# Patient Record
Sex: Female | Born: 1990 | Hispanic: Yes | Marital: Single
Health system: Southern US, Community
[De-identification: ages and names within clinical notes are randomized; demographics above are authoritative.]

---

## 2020-08-10 ENCOUNTER — Emergency Department: Payer: Self-pay

## 2020-08-10 ENCOUNTER — Other Ambulatory Visit: Payer: Self-pay

## 2020-08-10 ENCOUNTER — Emergency Department
Admission: EM | Admit: 2020-08-10 | Discharge: 2020-08-11 | Disposition: A | Discharge status: Other Acute Inpatient Hospital | Payer: Self-pay | Attending: Emergency Medicine | Admitting: Emergency Medicine

## 2020-08-10 DIAGNOSIS — S32010A Wedge compression fracture of first lumbar vertebra, initial encounter for closed fracture: Secondary | ICD-10-CM | POA: Insufficient documentation

## 2020-08-10 DIAGNOSIS — Z20822 Contact with and (suspected) exposure to covid-19: Secondary | ICD-10-CM | POA: Insufficient documentation

## 2020-08-10 DIAGNOSIS — S32000A Wedge compression fracture of unspecified lumbar vertebra, initial encounter for closed fracture: Secondary | ICD-10-CM

## 2020-08-10 DIAGNOSIS — S32020A Wedge compression fracture of second lumbar vertebra, initial encounter for closed fracture: Secondary | ICD-10-CM | POA: Insufficient documentation

## 2020-08-10 DIAGNOSIS — S82302A Unspecified fracture of lower end of left tibia, initial encounter for closed fracture: Secondary | ICD-10-CM | POA: Insufficient documentation

## 2020-08-10 DIAGNOSIS — X58XXXA Exposure to other specified factors, initial encounter: Secondary | ICD-10-CM | POA: Insufficient documentation

## 2020-08-10 DIAGNOSIS — S32030A Wedge compression fracture of third lumbar vertebra, initial encounter for closed fracture: Secondary | ICD-10-CM | POA: Insufficient documentation

## 2020-08-10 LAB — CBC WITH DIFFERENTIAL/PLATELET
Abs Immature Granulocytes: 0.11 10*3/uL — ABNORMAL HIGH (ref 0.00–0.07)
Basophils Absolute: 0 10*3/uL (ref 0.0–0.1)
Basophils Relative: 0 %
Eosinophils Absolute: 0.1 10*3/uL (ref 0.0–0.5)
Eosinophils Relative: 0 %
HCT: 36.9 % (ref 36.0–46.0)
Hemoglobin: 12.7 g/dL (ref 12.0–15.0)
Immature Granulocytes: 1 %
Lymphocytes Relative: 16 %
Lymphs Abs: 1.9 10*3/uL (ref 0.7–4.0)
MCH: 32.8 pg (ref 26.0–34.0)
MCHC: 34.4 g/dL (ref 30.0–36.0)
MCV: 95.3 fL (ref 80.0–100.0)
Monocytes Absolute: 0.9 10*3/uL (ref 0.1–1.0)
Monocytes Relative: 8 %
Neutro Abs: 9.2 10*3/uL — ABNORMAL HIGH (ref 1.7–7.7)
Neutrophils Relative %: 75 %
Platelets: 422 10*3/uL — ABNORMAL HIGH (ref 150–400)
RBC: 3.87 MIL/uL (ref 3.87–5.11)
RDW: 12.4 % (ref 11.5–15.5)
WBC: 12.2 10*3/uL — ABNORMAL HIGH (ref 4.0–10.5)
nRBC: 0 % (ref 0.0–0.2)

## 2020-08-10 LAB — BASIC METABOLIC PANEL
Anion gap: 9 (ref 5–15)
BUN: 12 mg/dL (ref 6–20)
CO2: 26 mmol/L (ref 22–32)
Calcium: 9.1 mg/dL (ref 8.9–10.3)
Chloride: 102 mmol/L (ref 98–111)
Creatinine, Ser: 0.54 mg/dL (ref 0.44–1.00)
GFR, Estimated: >60 mL/min (ref >60)
Glucose, Bld: 91 mg/dL (ref 70–99)
Potassium: 4.1 mmol/L (ref 3.5–5.1)
Sodium: 137 mmol/L (ref 135–145)

## 2020-08-10 MED ORDER — HYDROMORPHONE HCL 1 MG/ML IJ SOLN
0.5000 mg | Freq: Once | INTRAMUSCULAR | Status: AC
Start: 2020-08-10 — End: 2020-08-10
  Administered 2020-08-10: 0.5 mg via INTRAVENOUS
  Filled 2020-08-10: qty 1

## 2020-08-10 MED ORDER — MORPHINE SULFATE (PF) 4 MG/ML IV SOLN
4.0000 mg | Freq: Once | INTRAVENOUS | Status: AC
  Administered 2020-08-10: 4 mg via INTRAVENOUS
  Filled 2020-08-10: qty 1

## 2020-08-10 MED ORDER — ONDANSETRON HCL 4 MG/2ML IJ SOLN
4.0000 mg | Freq: Once | INTRAMUSCULAR | Status: AC
Start: 2020-08-10 — End: 2020-08-10
  Administered 2020-08-10: 4 mg via INTRAVENOUS
  Filled 2020-08-10: qty 2

## 2020-08-10 NOTE — ED Notes (Signed)
[facility] on unit for transport to [facility]

## 2020-08-10 NOTE — ED Provider Notes (Signed)
[facility] Emergency Department Provider Note ____________________________________________   Event Date/Time   First MD Initiated Contact with Patient 08/10/20 1523     (approximate)  I have reviewed the triage vital signs and the nursing notes.   HISTORY  Chief Complaint Extremity Pain (Left lower leg)  HPI and ROS obtained via in-person Spanish interpreter  HPI Chloe Chapman is a 31 y.o. female with no known active medical problems who presents with worsening left leg pain after a surgery last week.  The patient had a "border fall" on 5/13 near [facility] and was treated at the [facility].  She was admitted and diagnosed with a left tibial fracture, pubic rami fractures, and L1-L3 compression fractures.  She was discharged on 5/20 with an external fixator on the left tibia.  She was recommended to follow-up in 2 weeks.  The patient states that late in the day of discharge she started to have more severe pain in her left sole and developed numbness in the toes of the left foot.  This has gotten worse over the last 2 days.  The patient flew from [place] to [facility] last night.  She denies any specific new injury but states that she was quite cramped in the seat and has been having difficulty moving around.  She denies any new back pain or pelvic pain.  No past medical history on file.  There are no problems to display for this patient.    Prior to Admission medications   Not on File    Allergies Patient has no known allergies.  No family history on file.  Social History    Review of Systems  Constitutional: No fever. Eyes: No redness. ENT: No sore throat. Cardiovascular: Denies chest pain. Respiratory: Denies shortness of breath. Gastrointestinal: No vomiting or diarrhea.  Genitourinary: Negative for dysuria.  Musculoskeletal: Negative for back pain. Skin: Negative for rash. Neurological: Negative for  headache.  Positive for left toe numbness.   ____________________________________________   PHYSICAL EXAM:  VITAL SIGNS: ED Triage Vitals  Enc Vitals Group     BP 08/10/20 1524 (!) 104/54     Pulse Rate 08/10/20 1524 92     Resp 08/10/20 1524 20     Temp 08/10/20 1524 97.8 F (36.6 C)     Temp Source 08/10/20 1524 Oral     SpO2 08/10/20 1524 100 %     Weight 08/10/20 1527 150 lb (68 kg)     Height 08/10/20 1527 5' (1.524 m)     Head Circumference --      Peak Flow --      Pain Score 08/10/20 1526 10     Pain Loc --      Pain Edu? --      Excl. in GC? --     Constitutional: Alert and oriented.  Uncomfortable appearing but in no acute distress. Eyes: Conjunctivae are normal.  Head: Atraumatic. Nose: No congestion/rhinnorhea. Mouth/Throat: Mucous membranes are moist.   Neck: Normal range of motion.  Cardiovascular: Normal rate, regular rhythm.  Good peripheral circulation. Respiratory: Normal respiratory effort.  No retractions.  Gastrointestinal: No distention.  Musculoskeletal: No lower extremity edema.  Extremities warm and well perfused.  Distal left lower extremity with external fixator in place.  Skin sites clean, dry, intact.  Subjective decreased sensation to the distal foot.  Motor intact in all toes.  2+ DP pulse.  No calf or popliteal swelling or tenderness.  Neurologic:  Normal speech and language. No gross focal neurologic deficits are appreciated.  Skin:  Skin is warm and dry. No rash noted. Psychiatric: Mood and affect are normal. Speech and behavior are normal.  ____________________________________________   LABS (all labs ordered are listed, but only abnormal results are displayed)  Labs Reviewed  CBC WITH DIFFERENTIAL/PLATELET - Abnormal; Notable for the following components:      Result Value   WBC 12.2 (*)    Platelets 422 (*)    Neutro Abs 9.2 (*)    Abs Immature Granulocytes 0.11 (*)    All other components within normal limits  RESP PANEL BY  RT-PCR (FLU A&B, COVID) ARPGX2  BASIC METABOLIC PANEL   ____________________________________________  EKG   ____________________________________________  RADIOLOGY  XR L tib/fib: Comminuted fracture of the distal tibia with Ex-Fix in place XR lumbar spine: Superior endplate fractures L1-L3 XR pelvis: Bilateral pubic rami fractures US venous LLE: No acute DVT ____________________________________________   PROCEDURES  Procedure(s) performed: No  Procedures  Critical Care performed: No ____________________________________________   INITIAL IMPRESSION / ASSESSMENT AND PLAN / ED COURSE  Pertinent labs & imaging results that were available during my care of the patient were reviewed by me and considered in my medical decision making (see chart for details).  30 year old female with no significant past medical history presents with worsening pain and numbness to the left leg after a recent tibial fracture and external fixator placement.  The patient initially was admitted to the hospital in [place] on 5/13 and diagnosed with a tibial fracture as well as L1-L3 lumbar compression fractures and pubic rami fractures.  She was discharged on 5/20 and came to the area on a flight last night.  The patient has no past medical records in Epic or Care Everywhere, however I reviewed the provided paper medical records that the patient brought with her.  These include imaging reports, a brief admission H&P, and discharge instructions.  It appears that the patient was planned for follow-up as an outpatient in 2 weeks.  The patient and significant other state that they were told they could follow-up here although it does not appear that any attempt was made to contact a specific physician here at [facility].  On exam, the patient is uncomfortable appearing but in no acute distress.  Her vital signs are normal.  She has an external fixator in place in the left lower leg and the skin incision sites are  intact.  She reports that the distal left foot is numb although she is able to move the toes slightly (limited by pain).  DP pulse is intact.  There is no erythema, induration, or abnormal warmth.  There is no calf, popliteal, or thigh tenderness.  Overall I suspect neuropraxia related to the tibial fracture and resulting edema especially since the patient had her leg down during the flight yesterday.  There is no evidence of acute vascular compromise and the patient has no risk factors for this.  I also have a low suspicion for DVT given the location of the pain and numbness.  The area of numbness corresponds more to the L4-L5 dermatome rather than the proximal lumbar the vertebrae that are fractured, so I do not suspect lumbar radiculopathy.  The tissues of the lower extremity are soft and there is no evidence of compartment syndrome.  We will give analgesia, obtain x-rays of the relevant areas to assess for any new injury, and then discussed with orthopedics.  ----------------------------------------- 10:52 PM  on 08/10/2020 -----------------------------------------  X-rays confirm the findings on the written reports from the radiology studies in [place].  I also obtained a left lower extremity DVT study which is negative.  The patient had some improvement with her pain after morphine, and now reports significant improvement in her pain after 0.5 mg of Dilaudid.  On reassessment she continues to have a strong distal pulse and is able to move her toes.  Given the patient's relatively severe pain and the fact that she does not have insurance or an orthopedist here to follow-up with, I recommended admission and the patient and her companion agreed.  I discussed the case with Dr. Joice Lofts from orthopedics who advised that the complex nature of the fracture exceeds what we are able to treat at [facility], and the patient requires evaluation at a center with orthopedic trauma capability.  Based on discussion with  him and with the patient we decided to contact [facility] for transfer.  I discussed the case with Dr. Cyril Mourning from the ED at [facility], who accepted the patient in transfer ED to ED.  Plan will be for orthopedic assessment there to determine further treatment and disposition.  The patient agrees with the plan.  She is stable for transfer at this time.  ____________________________________________   FINAL CLINICAL IMPRESSION(S) / ED DIAGNOSES  Final diagnoses:  Closed fracture of distal end of left tibia, unspecified fracture morphology, initial encounter  Compression fracture of lumbar vertebra, unspecified lumbar vertebral level, initial encounter (HCC)      NEW MEDICATIONS STARTED DURING THIS VISIT:  New Prescriptions   No medications on file     Note:  This document was prepared using Dragon voice recognition software and may include unintentional dictation errors.   Dionne Bucy, MD 08/11/20 [phone]

## 2020-08-10 NOTE — ED Notes (Signed)
Pulse located in lower left extremity via doppler.

## 2020-08-10 NOTE — ED Notes (Signed)
US at bedside

## 2020-08-10 NOTE — ED Triage Notes (Signed)
BIB EMS from home. Patient is hispanic and doesn't speak english. Pt feel 2 weeks ago [place]. Has some sort of fracture to left lower extremity. Wearing device on foot. Wearing back brace. Flew on long flight from [place] to here yesterday. Now complaint of pain in left extremity. EMS advised family called 911 bc something was wrong with the patient. Vitals normal for EMS>

## 2020-08-10 NOTE — ED Notes (Signed)
ED TO INPATIENT HANDOFF REPORT  ED Nurse Name and Phone #:   S Name/Age/Gender Chloe Chapman 30 y.o. female Room/Bed: ED24A/ED24A  Code Status   Code Status: Not on file  Home/SNF/Other Home Patient oriented to: self, place, time and situation Is this baseline? Yes   Triage Complete: Triage complete  Chief Complaint fall  Triage Note BIB EMS from home. Patient is hispanic and doesn't speak english. Pt [place]. Has some sort of fracture to left lower extremity. Wearing device on foot. Wearing back brace. Flew on long flight from [place] to here yesterday. Now complaint of pain in left extremity. EMS advised family called 911 bc something was wrong with the patient. Vitals normal for EMS>     Allergies No Known Allergies  Level of Care/Admitting Diagnosis ED Disposition    ED Disposition Condition Comment   Transfer to Another Facility  The patient appears reasonably stabilized for transfer considering the current resources, flow, and capabilities available in the ED at this time, and I doubt any other [facility] requiring further screening and/or treatment in the ED prior to transfer is p resent.       B Medical/Surgery History No past medical history on file.    A IV Location/Drains/Wounds Patient Lines/Drains/Airways Status    Active Line/Drains/Airways    Name Placement date Placement time Site Days   Peripheral IV 08/10/20 Left Antecubital 08/10/20  1547  Antecubital  less than 1          Intake/Output Last 24 hours No intake or output data in the 24 hours ending 08/10/20 2251  Labs/Imaging Results for orders placed or performed during the hospital encounter of 08/10/20 (from the past 48 hour(s))  Basic metabolic panel     Status: None   Collection Time: 08/10/20  3:35 PM  Result Value Ref Range   Sodium 137 135 - 145 mmol/L   Potassium 4.1 3.5 - 5.1 mmol/L    Comment: HEMOLYSIS AT THIS LEVEL MAY AFFECT RESULT   Chloride 102 98 -  111 mmol/L   CO2 26 22 - 32 mmol/L   Glucose, Bld 91 70 - 99 mg/dL    Comment: Glucose reference range applies only to samples taken after fasting for at least 8 hours.   BUN 12 6 - 20 mg/dL   Creatinine, Ser 1.82 0.44 - 1.00 mg/dL   Calcium 9.1 8.9 - 99.3 mg/dL   GFR, Estimated >71 >69 mL/min    Comment: (NOTE) Calculated using the CKD-EPI Creatinine Equation (2021)    Anion gap 9 5 - 15    Comment: Performed at [facility], [address] [place], [place] [address]  CBC with Differential     Status: Abnormal   Collection Time: 08/10/20  3:35 PM  Result Value Ref Range   WBC 12.2 (H) 4.0 - 10.5 K/uL   RBC 3.87 3.87 - 5.11 MIL/uL   Hemoglobin 12.7 12.0 - 15.0 g/dL   HCT 81.0 17.5 - 10.2 %   MCV 95.3 80.0 - 100.0 fL   MCH 32.8 26.0 - 34.0 pg   MCHC 34.4 30.0 - 36.0 g/dL   RDW 58.5 27.7 - 82.4 %   Platelets 422 (H) 150 - 400 K/uL   nRBC 0.0 0.0 - 0.2 %   Neutrophils Relative % 75 %   Neutro Abs 9.2 (H) 1.7 - 7.7 K/uL   Lymphocytes Relative 16 %   Lymphs Abs 1.9 0.7 - 4.0 K/uL  Monocytes Relative 8 %   Monocytes Absolute 0.9 0.1 - 1.0 K/uL   Eosinophils Relative 0 %   Eosinophils Absolute 0.1 0.0 - 0.5 K/uL   Basophils Relative 0 %   Basophils Absolute 0.0 0.0 - 0.1 K/uL   Immature Granulocytes 1 %   Abs Immature Granulocytes 0.11 (H) 0.00 - 0.07 K/uL    Comment: Performed at [facility], [address] [place], [place] [address]   DG Lumbar Spine 2-3 Views  Result Date: 08/10/2020 CLINICAL DATA:  L1-3 fractures last week at outside hospital EXAM: LUMBAR SPINE - 2-3 VIEW COMPARISON:  None. FINDINGS: Superior endplate fractures of L1, L2, and L3 with less than 10% loss of height of any vertebral body. No evidence of posterior element involvement by radiograph. Normal alignment. No posterior cortex involvement. Sacroiliac joints are congruent. IMPRESSION: Superior endplate fractures of L1, L2, and L3 with less than 10% loss of height. Electronically  Signed   By: Narda Rutherford M.D.   On: 08/10/2020 17:04   DG Pelvis 1-2 Views  Result Date: 08/10/2020 CLINICAL DATA:  Pubic rami fractures at outside hospital 1 week ago, worsening pain EXAM: PELVIS - 1-2 VIEW COMPARISON:  None. FINDINGS: Mildly displaced left inferior pubic ramus fracture. There is a nondisplaced left superior pubis ramus fracture at the puboacetabular junction. No visualized sacral fracture by radiograph. Pubic symphysis and sacroiliac joints are congruent. Femoral heads are normally located. IMPRESSION: Mildly displaced left inferior pubic ramus fracture and nondisplaced left superior pubis ramus fracture at the puboacetabular junction. Electronically Signed   By: Narda Rutherford M.D.   On: 08/10/2020 17:04   DG Tibia/Fibula Left  Result Date: 08/10/2020 CLINICAL DATA:  S/p external fixation at outside hospital 1 week ago, worsening pain EXAM: LEFT TIBIA AND FIBULA - 2 VIEW COMPARISON:  None. FINDINGS: External fixator in place. There is a comminuted and displaced distal tibial shaft fracture. Fracture also involves the medial malleolus, partially obscured by overlapping external structures. There is suspected distal fracture involvement of the central tibial talar joint. No visualized fibular fracture. IMPRESSION: 1. Comminuted and displaced distal tibial shaft fracture. Fracture extends to involve the medial malleolus, and likely central distal tibia at the tibial talar joint. 2. No visualized fibular fracture. 3. External fixator in place. Electronically Signed   By: Narda Rutherford M.D.   On: 08/10/2020 17:07   US Venous Img Lower Unilateral Left  Result Date: 08/10/2020 CLINICAL DATA:  Leg pain.  Recent tibial fracture. EXAM: LEFT LOWER EXTREMITY VENOUS DOPPLER ULTRASOUND TECHNIQUE: Gray-scale sonography with compression, as well as color and duplex ultrasound, were performed to evaluate the deep venous system(s) from the level of the common femoral vein through the  popliteal and proximal calf veins. COMPARISON:  None. FINDINGS: VENOUS Normal compressibility of the common femoral, superficial femoral, and popliteal veins. Calf veins cannot be assessed due to overlying external fixator. Visualized portions of profunda femoral vein and great saphenous vein unremarkable. No filling defects to suggest DVT on grayscale or color Doppler imaging. Doppler waveforms show normal direction of venous flow, normal respiratory plasticity and response to augmentation. Limited views of the contralateral common femoral vein are unremarkable. OTHER None. Limitations: External fixator obscuring the calf. IMPRESSION: No evidence of left lower extremity DVT. Electronically Signed   By: Narda Rutherford M.D.   On: 08/10/2020 18:50    Pending Labs [employer] (From admission, onward)          Start     Ordered  08/10/20 2127  Resp Panel by RT-PCR (Flu A&B, Covid) Nasopharyngeal Swab  (Tier 2 - Symptomatic/asymptomatic with Precautions )  Once,   STAT       Question Answer Comment  Is this test for diagnosis or screening Screening   Symptomatic for COVID-19 as defined by CDC No   Hospitalized for COVID-19 No   Admitted to ICU for COVID-19 No   Previously tested for COVID-19 No   Resident in a congregate (group) care setting No   Employed in healthcare setting No   Pregnant No   Has patient completed COVID vaccination(s) (2 doses of Pfizer/Moderna 1 dose of [employer]) Unknown      08/10/20 2126          Vitals/Pain Today's Vitals   08/10/20 1645 08/10/20 1645 08/10/20 1730 08/10/20 1830  BP: 100/62  102/68 99/70  Pulse: 94  64 79  Resp: 16  20 20   Temp:      TempSrc:      SpO2: 99%  100% 100%  Weight:      Height:      PainSc:  0-No pain      Isolation Precautions No active isolations  Medications Medications  ondansetron (ZOFRAN) injection 4 mg (4 mg Intravenous Given 08/10/20 1555)  morphine 4 MG/ML injection 4 mg (4 mg Intravenous Given  08/10/20 1555)  HYDROmorphone (DILAUDID) injection 0.5 mg (0.5 mg Intravenous Given 08/10/20 2142)    Mobility walks High fall risk   Focused Assessments    R Recommendations: See Admitting Provider Note  Report given to:   Additional Notes:

## 2020-08-10 NOTE — ED Notes (Signed)
MD at bedside.  Pt advised she was told that she was scheduled for another surgery on a week. She was told to come here to this hospital to have the new surgery done. Pt brought her file from the hospital in which she had the surgery.

## 2020-08-10 NOTE — ED Notes (Signed)
Report to charge nurse, at [facility] [place] and [place], [place]; per [employer] no ETA at this time

## 2020-08-10 NOTE — ED Notes (Signed)
Report to Darian with [facility]

## 2020-08-10 NOTE — ED Notes (Signed)
Waiting on interpreter to complete triage information.

## 2020-08-11 ENCOUNTER — Ambulatory Visit (HOSPITAL_COMMUNITY)
Admission: AD | Admit: 2020-08-11 | Discharge: 2020-08-11 | Disposition: A | Discharge status: Home / Self Care | Payer: Self-pay | Source: Other Acute Inpatient Hospital | Attending: Emergency Medicine | Admitting: Emergency Medicine

## 2020-08-11 DIAGNOSIS — S32009A Unspecified fracture of unspecified lumbar vertebra, initial encounter for closed fracture: Secondary | ICD-10-CM | POA: Insufficient documentation

## 2020-08-11 DIAGNOSIS — Y939 Activity, unspecified: Secondary | ICD-10-CM | POA: Insufficient documentation

## 2020-08-11 DIAGNOSIS — Y929 Unspecified place or not applicable: Secondary | ICD-10-CM | POA: Insufficient documentation

## 2020-08-11 LAB — RESP PANEL BY RT-PCR (FLU A&B, COVID) ARPGX2
Influenza A by PCR: NEGATIVE
Influenza B by PCR: NEGATIVE
SARS Coronavirus 2 by RT PCR: NEGATIVE

## 2020-08-14 MED FILL — Hydromorphone HCl Inj 1 MG/ML: INTRAMUSCULAR | Qty: 1 | Status: AC

## 2021-11-13 IMAGING — CR DG PELVIS 1-2V
1 series · 1 of 1 positions shown · non-contrast
Comparison: None.

CLINICAL DATA: Pubic rami fractures at outside hospital 1 week ago,
worsening pain

EXAM:
PELVIS - 1-2 VIEW

[dg pelvis 1-2 views]
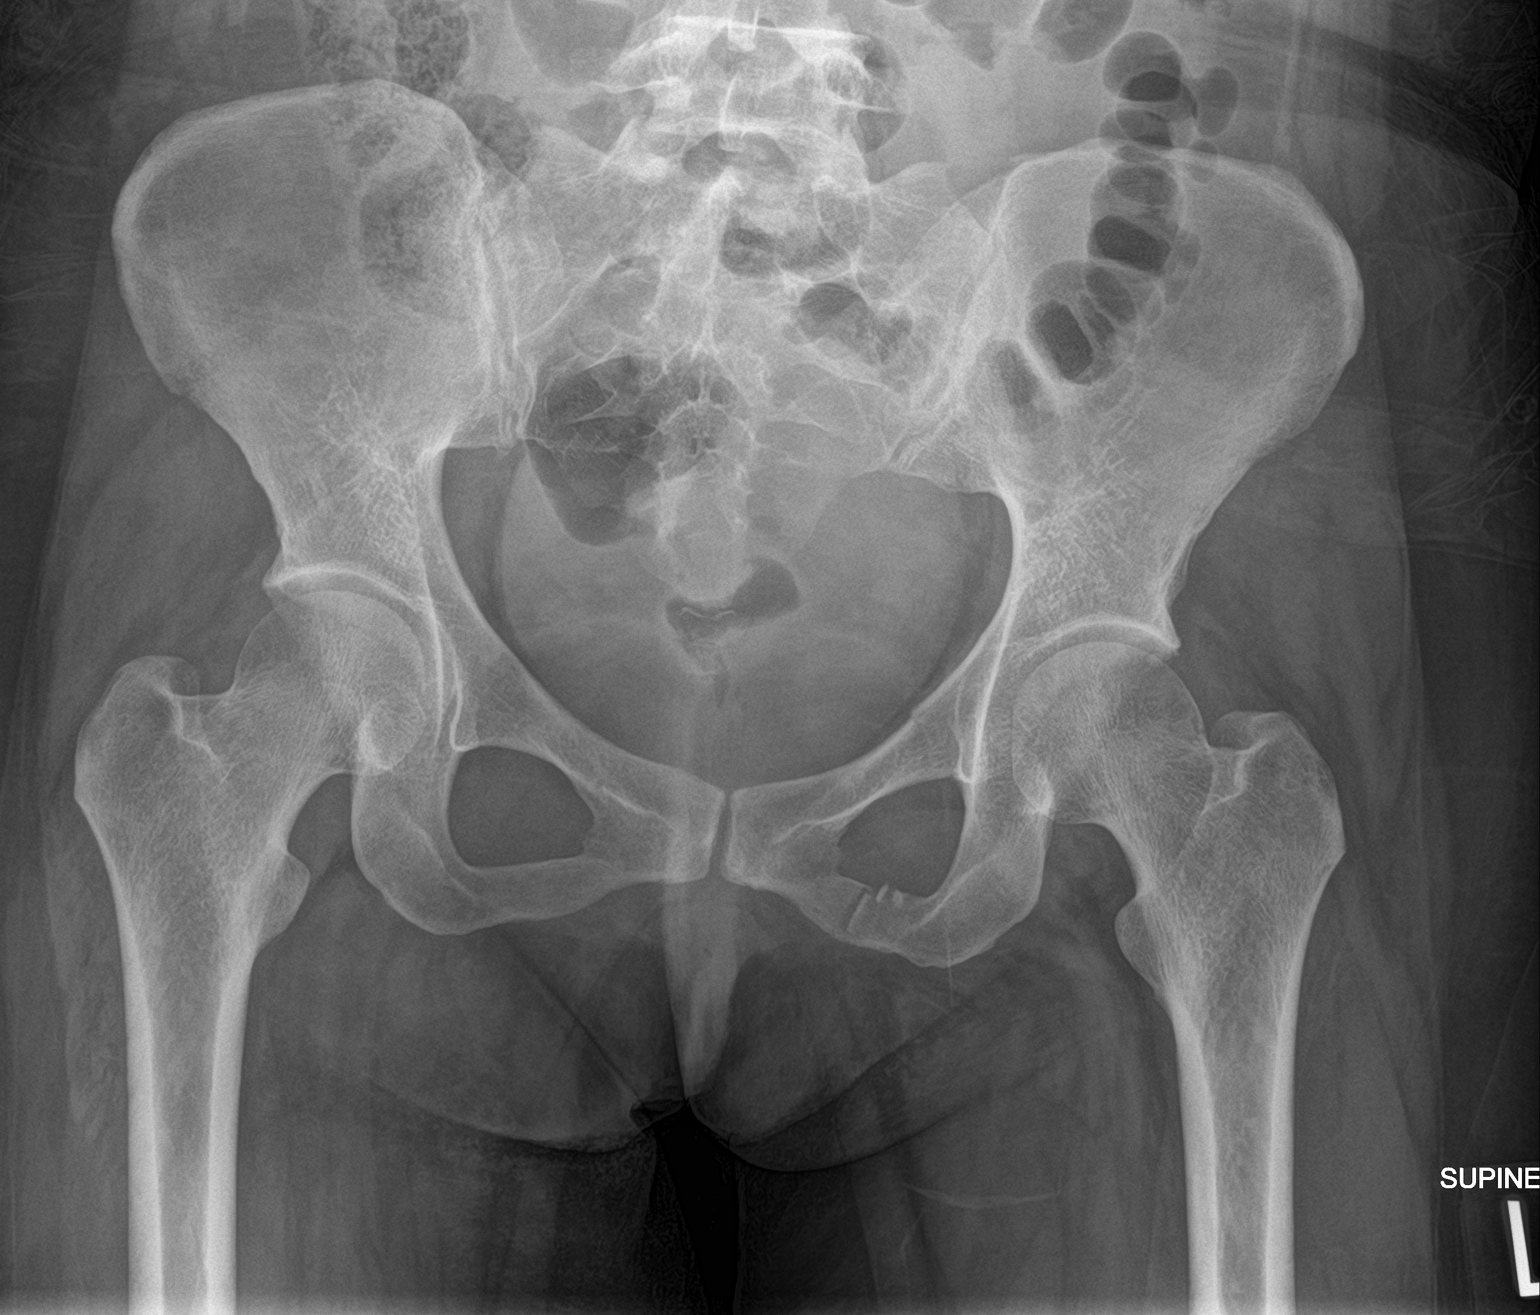

[1 of 1 positions shown; findings below may reference images not displayed]

FINDINGS: Mildly displaced left inferior pubic ramus fracture. There is a
nondisplaced left superior pubis ramus fracture at the
puboacetabular junction. No visualized sacral fracture by
radiograph. Pubic symphysis and sacroiliac joints are congruent.
Femoral heads are normally located.
IMPRESSION: Mildly displaced left inferior pubic ramus fracture and nondisplaced
left superior pubis ramus fracture at the puboacetabular junction.

## 2021-11-13 IMAGING — CR DG LUMBAR SPINE 2-3V
1 series · 3 of 3 positions shown · non-contrast
Comparison: None.

CLINICAL DATA: L1-3 fractures last week at outside hospital

EXAM:
LUMBAR SPINE - 2-3 VIEW

[Series 1: dg lumbar spine 2-3 views · 0.14mm/px · 3 of 3 slices shown]
[im 1/3]
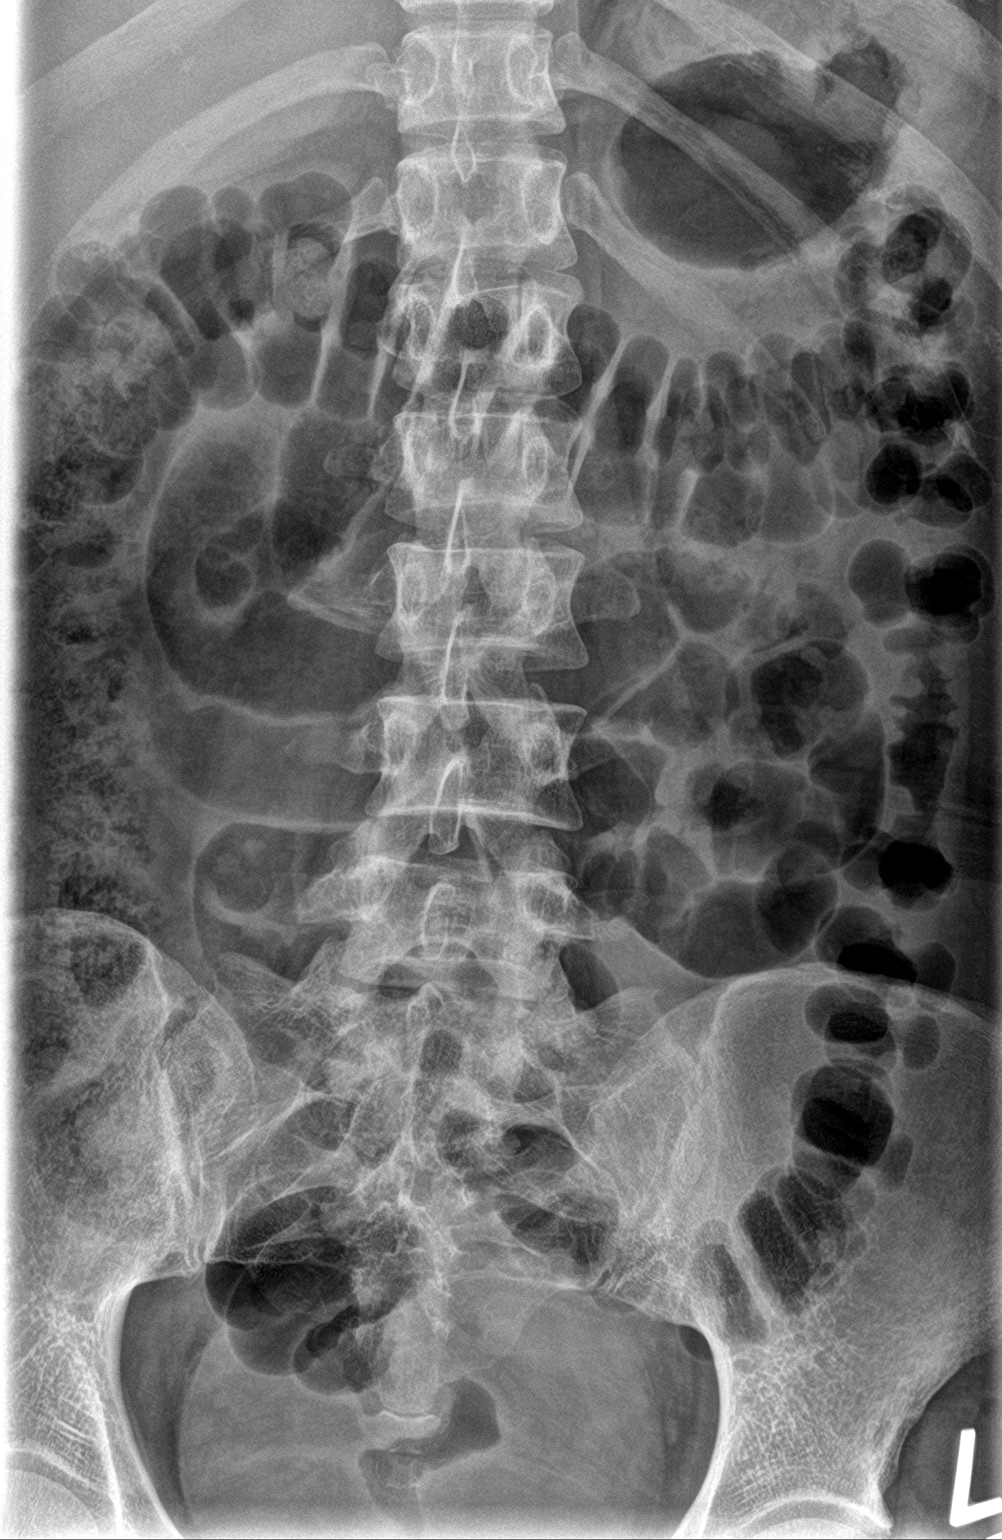
[im 2/3]
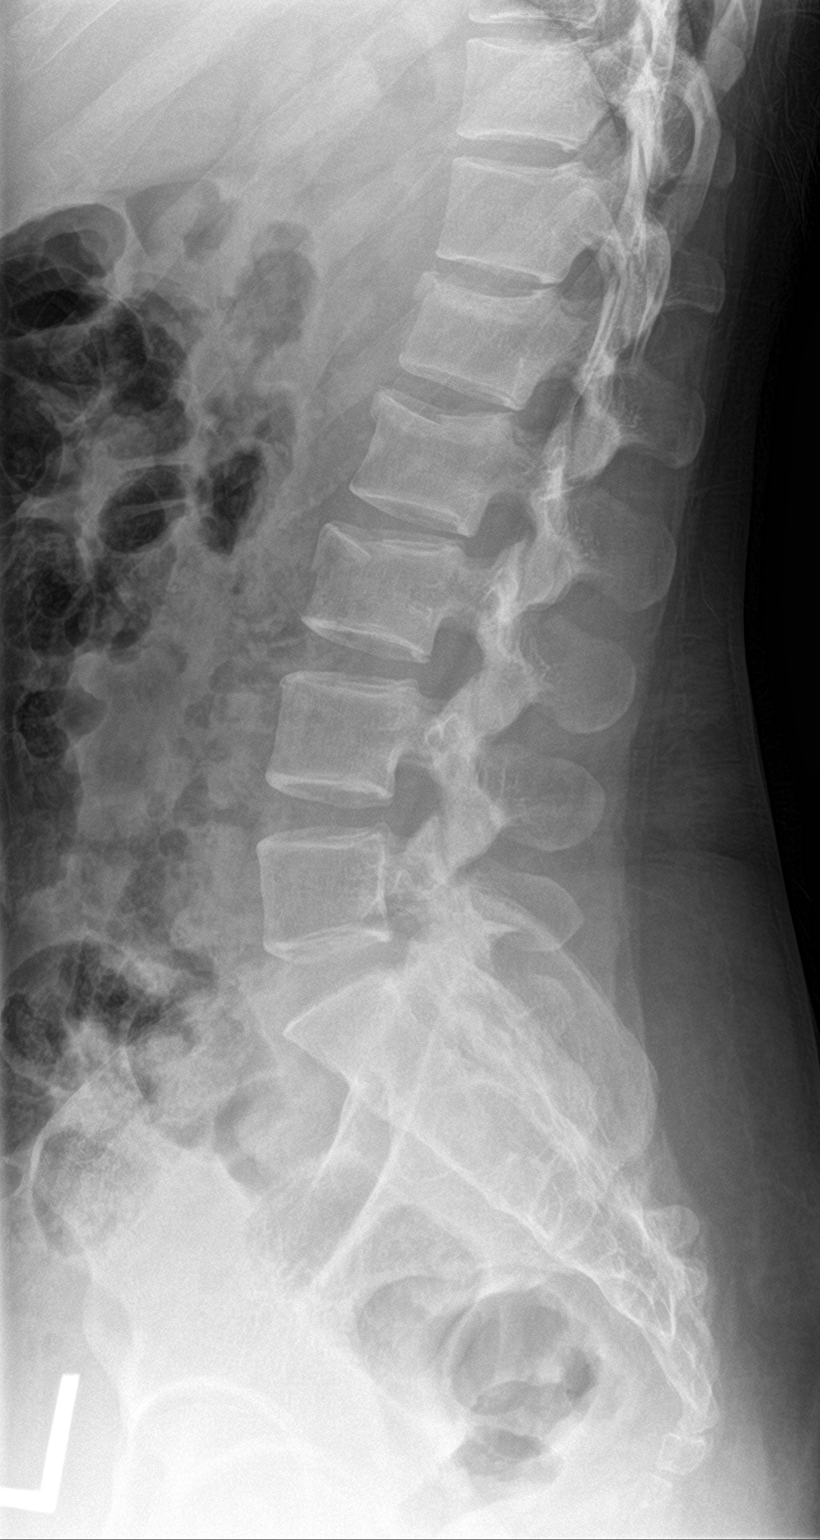
[im 3/3]
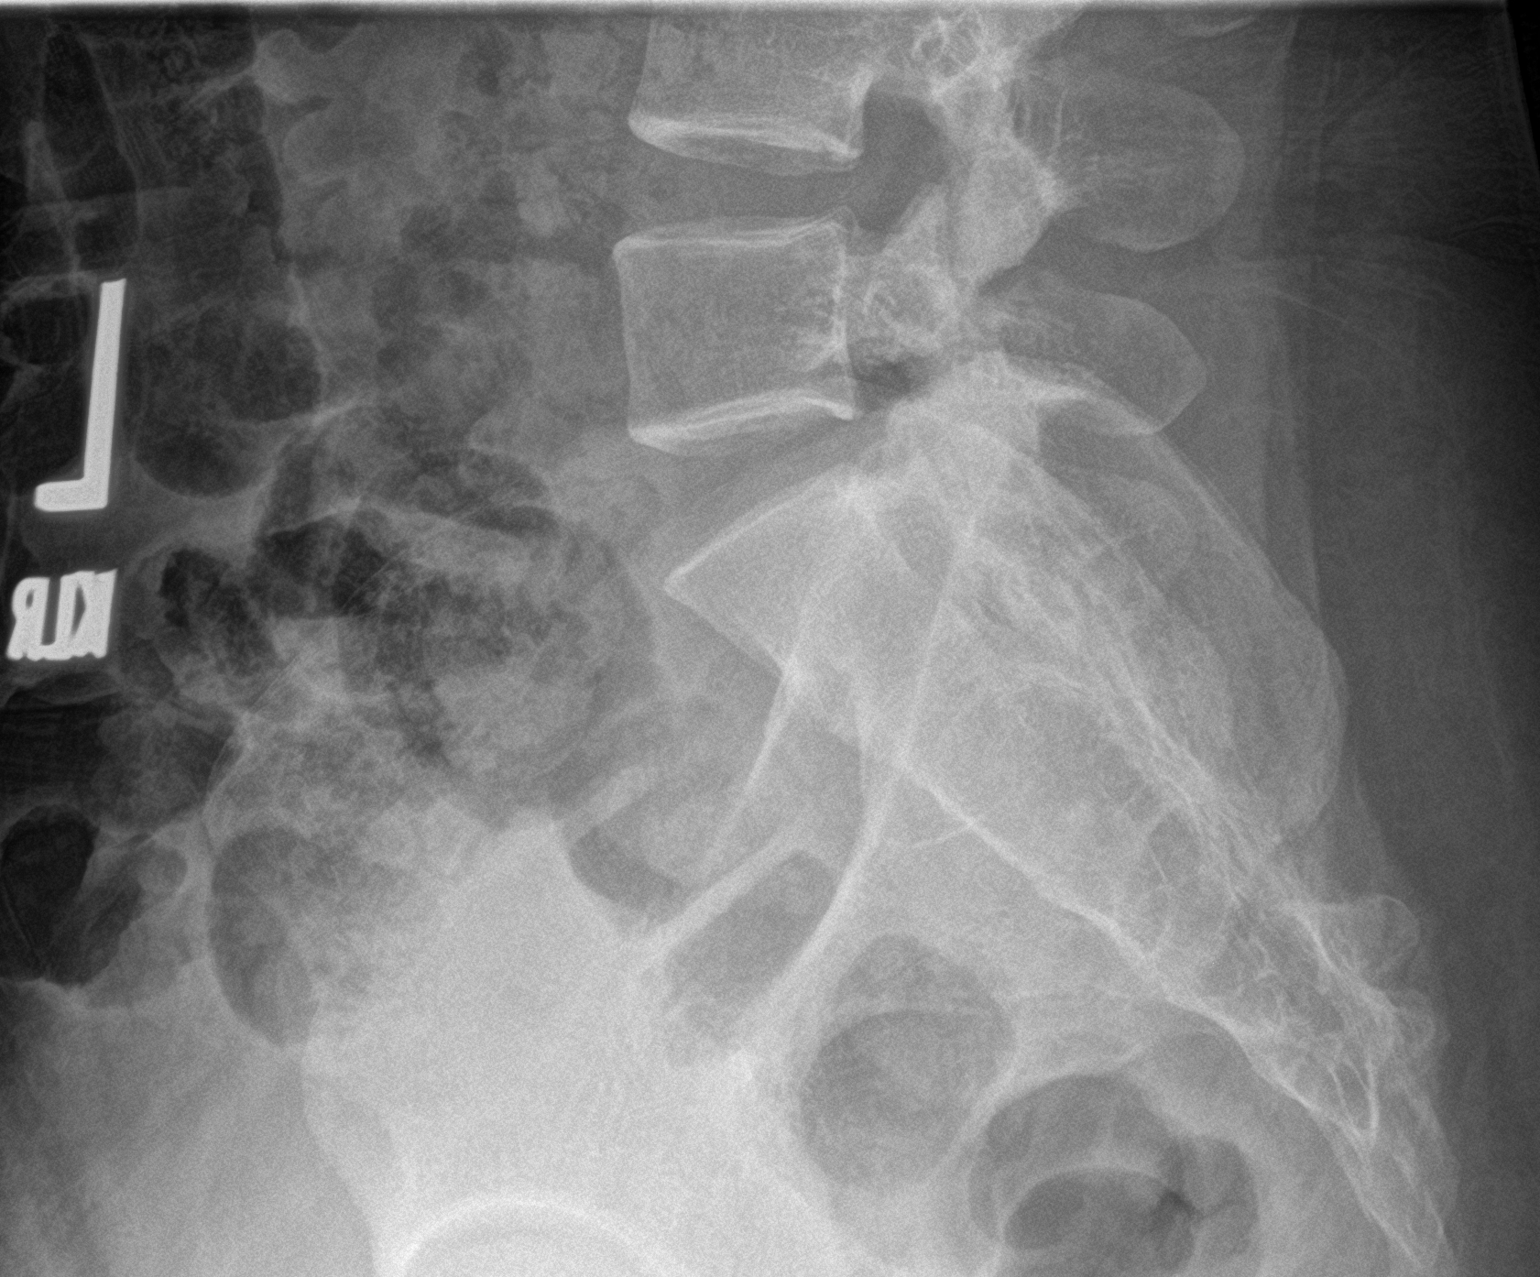

[3 of 3 positions shown; findings below may reference images not displayed]

FINDINGS: Superior endplate fractures of L1, L2, and L3 with less than 10%
loss of height of any vertebral body. No evidence of posterior
element involvement by radiograph. Normal alignment. No posterior
cortex involvement. Sacroiliac joints are congruent.
IMPRESSION: Superior endplate fractures of L1, L2, and L3 with less than 10%
loss of height.
